# Patient Record
Sex: Female | Born: 1945 | Race: White | Hispanic: No | Marital: Married | Smoking: Never smoker
Health system: Southern US, Community
[De-identification: ages and names within clinical notes are randomized; demographics above are authoritative.]

## PROBLEM LIST (undated history)

## (undated) DIAGNOSIS — I10 Essential (primary) hypertension: Secondary | ICD-10-CM

## (undated) HISTORY — PX: FRACTURE SURGERY: SHX138

---

## 2017-12-05 ENCOUNTER — Emergency Department (HOSPITAL_COMMUNITY)
Admission: EM | Admit: 2017-12-05 | Discharge: 2017-12-05 | Disposition: A | Discharge status: Home / Self Care | Payer: BLUE CROSS/BLUE SHIELD | Attending: Emergency Medicine | Admitting: Emergency Medicine

## 2017-12-05 ENCOUNTER — Emergency Department (HOSPITAL_COMMUNITY): Payer: BLUE CROSS/BLUE SHIELD

## 2017-12-05 ENCOUNTER — Encounter (HOSPITAL_COMMUNITY): Payer: Self-pay | Admitting: Emergency Medicine

## 2017-12-05 DIAGNOSIS — S39012A Strain of muscle, fascia and tendon of lower back, initial encounter: Secondary | ICD-10-CM | POA: Insufficient documentation

## 2017-12-05 DIAGNOSIS — I10 Essential (primary) hypertension: Secondary | ICD-10-CM | POA: Diagnosis not present

## 2017-12-05 DIAGNOSIS — S161XXA Strain of muscle, fascia and tendon at neck level, initial encounter: Secondary | ICD-10-CM | POA: Diagnosis not present

## 2017-12-05 DIAGNOSIS — Y929 Unspecified place or not applicable: Secondary | ICD-10-CM | POA: Diagnosis not present

## 2017-12-05 DIAGNOSIS — Y939 Activity, unspecified: Secondary | ICD-10-CM | POA: Diagnosis not present

## 2017-12-05 DIAGNOSIS — Y999 Unspecified external cause status: Secondary | ICD-10-CM | POA: Diagnosis not present

## 2017-12-05 DIAGNOSIS — S199XXA Unspecified injury of neck, initial encounter: Secondary | ICD-10-CM | POA: Diagnosis present

## 2017-12-05 HISTORY — DX: Essential (primary) hypertension: I10

## 2017-12-05 MED ORDER — CYCLOBENZAPRINE HCL 5 MG PO TABS: 5.0000 mg | ORAL_TABLET | Freq: Two times a day (BID) | ORAL | 0 refills | Status: AC | PRN
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
